# Patient Record
Sex: Male | Born: 1990 | Race: White | Hispanic: No
Health system: Southern US, Community
[De-identification: ages and names within clinical notes are randomized; demographics above are authoritative.]

---

## 2014-07-28 ENCOUNTER — Emergency Department (HOSPITAL_COMMUNITY)

## 2014-07-28 ENCOUNTER — Emergency Department (HOSPITAL_COMMUNITY)
Admission: EM | Admit: 2014-07-28 | Discharge: 2014-07-28 | Disposition: A | Discharge status: Home / Self Care | Attending: Emergency Medicine | Admitting: Emergency Medicine

## 2014-07-28 ENCOUNTER — Encounter (HOSPITAL_COMMUNITY): Payer: Self-pay

## 2014-07-28 DIAGNOSIS — F1910 Other psychoactive substance abuse, uncomplicated: Secondary | ICD-10-CM

## 2014-07-28 DIAGNOSIS — F419 Anxiety disorder, unspecified: Secondary | ICD-10-CM | POA: Insufficient documentation

## 2014-07-28 DIAGNOSIS — R0789 Other chest pain: Secondary | ICD-10-CM | POA: Insufficient documentation

## 2014-07-28 DIAGNOSIS — F1210 Cannabis abuse, uncomplicated: Secondary | ICD-10-CM | POA: Diagnosis present

## 2014-07-28 LAB — CBC WITH DIFFERENTIAL/PLATELET
Basophils Absolute: 0 10*3/uL (ref 0.0–0.1)
Basophils Relative: 0 % (ref 0–1)
EOS PCT: 1 % (ref 0–5)
Eosinophils Absolute: 0 10*3/uL (ref 0.0–0.7)
HEMATOCRIT: 42.7 % (ref 39.0–52.0)
Hemoglobin: 14.9 g/dL (ref 13.0–17.0)
LYMPHS PCT: 31 % (ref 12–46)
Lymphs Abs: 1.6 10*3/uL (ref 0.7–4.0)
MCH: 31.9 pg (ref 26.0–34.0)
MCHC: 34.9 g/dL (ref 30.0–36.0)
MCV: 91.4 fL (ref 78.0–100.0)
MONO ABS: 0.6 10*3/uL (ref 0.1–1.0)
Monocytes Relative: 12 % (ref 3–12)
Neutro Abs: 2.8 10*3/uL (ref 1.7–7.7)
Neutrophils Relative %: 56 % (ref 43–77)
Platelets: 224 10*3/uL (ref 150–400)
RBC: 4.67 MIL/uL (ref 4.22–5.81)
RDW: 13.1 % (ref 11.5–15.5)
WBC: 5.1 10*3/uL (ref 4.0–10.5)

## 2014-07-28 LAB — COMPREHENSIVE METABOLIC PANEL
ALBUMIN: 4.2 g/dL (ref 3.5–5.2)
ALT: 12 U/L (ref 0–53)
AST: 18 U/L (ref 0–37)
Alkaline Phosphatase: 106 U/L (ref 39–117)
Anion gap: 11 (ref 5–15)
BUN: 9 mg/dL (ref 6–23)
CALCIUM: 9.5 mg/dL (ref 8.4–10.5)
CO2: 26 mEq/L (ref 19–32)
CREATININE: 0.8 mg/dL (ref 0.50–1.35)
Chloride: 104 mEq/L (ref 96–112)
GFR calc Af Amer: >90 mL/min (ref >90)
GFR calc non Af Amer: >90 mL/min (ref >90)
Glucose, Bld: 92 mg/dL (ref 70–99)
Potassium: 4.2 mEq/L (ref 3.7–5.3)
Sodium: 141 mEq/L (ref 137–147)
Total Bilirubin: 0.6 mg/dL (ref 0.3–1.2)
Total Protein: 7.3 g/dL (ref 6.0–8.3)

## 2014-07-28 LAB — RAPID URINE DRUG SCREEN, HOSP PERFORMED
Amphetamines: NOT DETECTED
BARBITURATES: NOT DETECTED
BENZODIAZEPINES: NOT DETECTED
COCAINE: NOT DETECTED
Opiates: NOT DETECTED
Tetrahydrocannabinol: NOT DETECTED

## 2014-07-28 MED ORDER — DIPHENHYDRAMINE HCL 50 MG/ML IJ SOLN
25.0000 mg | Freq: Once | INTRAMUSCULAR | Status: AC
  Administered 2014-07-28: 25 mg via INTRAVENOUS
  Filled 2014-07-28: qty 1

## 2014-07-28 MED ORDER — LORAZEPAM 2 MG/ML IJ SOLN
1.0000 mg | Freq: Once | INTRAMUSCULAR | Status: AC
  Administered 2014-07-28: 1 mg via INTRAVENOUS
  Filled 2014-07-28: qty 1

## 2014-07-28 MED ORDER — DIAZEPAM 5 MG/ML IJ SOLN
5.0000 mg | Freq: Once | INTRAMUSCULAR | Status: AC
  Administered 2014-07-28: 5 mg via INTRAVENOUS
  Filled 2014-07-28: qty 2

## 2014-07-28 MED ORDER — SODIUM CHLORIDE 0.9 % IV SOLN
1000.0000 mL | INTRAVENOUS | Status: DC
  Administered 2014-07-28: 1000 mL via INTRAVENOUS

## 2014-07-28 MED ORDER — SODIUM CHLORIDE 0.9 % IV SOLN
1000.0000 mL | Freq: Once | INTRAVENOUS | Status: AC
  Administered 2014-07-28: 1000 mL via INTRAVENOUS

## 2014-07-28 NOTE — ED Provider Notes (Signed)
CSN: [id number]     Arrival date & time 07/28/14  [id number] History   First MD Initiated Contact with Patient 07/28/14 [phone]     Chief Complaint  Patient presents with  . Drug Overdose     (Consider location/radiation/quality/duration/timing/severity/associated sxs/prior Treatment) HPI Comments: Patient is a 23 year old male in the custody of the [facility] who presents to theemergency department after having symptoms that he thinks may be related to smoking K2. The patient states that he smoked one joint of 22. The correctional officers says that this May have happened about 8 or 8:30 in the morning. The patient states that he began to have a panic attack, felt like that his chest was closing in, that he could not breathe well, his vision was altered, and he was having muscle cramps. He states that the sensation of his chest closing in has resolved, but he continues to have muscle cramps and feels a little jittery. The patient states that this was his first interaction with K2. He denies taking any other medications, alcohol, or any other drugs.  Patient is a 23 y.o. male presenting with Overdose. The history is provided by the patient ([employer] engineerdetention officers).  Drug Overdose This is a new problem. The current episode started today. Associated symptoms include headaches. Pertinent negatives include no abdominal pain, arthralgias, chest pain, coughing or neck pain.    No past medical history on file. History reviewed. No pertinent past surgical history. No family history on file. History  Substance Use Topics  . Smoking status: Unknown If Ever Smoked  . Smokeless tobacco: Not on file  . Alcohol Use: No    Review of Systems  Constitutional: Negative for activity change.       All ROS Neg except as noted in HPI  HENT: Negative for nosebleeds.   Eyes: Positive for visual disturbance. Negative for photophobia and discharge.  Respiratory: Positive for chest tightness. Negative for  cough, shortness of breath and wheezing.   Cardiovascular: Positive for palpitations. Negative for chest pain.  Gastrointestinal: Negative for abdominal pain and blood in stool.  Genitourinary: Negative for dysuria, frequency and hematuria.  Musculoskeletal: Negative for back pain, arthralgias and neck pain.  Skin: Negative.   Neurological: Positive for light-headedness and headaches. Negative for dizziness, seizures and speech difficulty.  Psychiatric/Behavioral: Negative for suicidal ideas, hallucinations and confusion.      Allergies  Review of patient's allergies indicates no known allergies.  Home Medications   Prior to Admission medications   Not on File   BP 147/76 mmHg  Pulse 95  Temp(Src) 97.9 F (36.6 C) (Oral)  Resp 16  Ht 6\' 1"  (1.854 m)  Wt 170 lb (77.111 kg)  BMI 22.43 kg/m2  SpO2 95% Physical Exam  Constitutional: He is oriented to person, place, and time. He appears well-developed and well-nourished.  Non-toxic appearance.  HENT:  Head: Normocephalic.  Right Ear: Tympanic membrane and external ear normal.  Left Ear: Tympanic membrane and external ear normal.  Eyes: EOM and lids are normal. Pupils are equal, round, and reactive to light.  Neck: Normal range of motion. Neck supple. Carotid bruit is not present.  Cardiovascular: Normal rate, regular rhythm, normal heart sounds, intact distal pulses and normal pulses.   Pulmonary/Chest: Breath sounds normal. No respiratory distress.  Abdominal: Soft. Bowel sounds are normal. He exhibits no distension. There is no tenderness. There is no guarding.  Musculoskeletal: Normal range of motion.  Intermittent tremors noted of upper and lower extremities. Distal pulses  2+. No edema.  Lymphadenopathy:       Head (right side): No submandibular adenopathy present.       Head (left side): No submandibular adenopathy present.    He has no cervical adenopathy.  Neurological: He is alert and oriented to person, place, and  time. He has normal strength. No cranial nerve deficit or sensory deficit. He exhibits normal muscle tone. Coordination normal.  Skin: Skin is warm and dry.  Psychiatric: His mood appears anxious. His speech is delayed. He expresses no suicidal plans and no homicidal plans.  Nursing note and vitals reviewed.   ED Course  Pt seen with me by Dr Adriana Simasook.  Procedures (including critical care time) CRITICAL CARE Performed by: Kathie DikeBRYANT,Travante Knee M Total critical care time: *40min** Critical care time was exclusive of separately billable procedures and treating other patients. Critical care was necessary to treat or prevent imminent or life-threatening deterioration. Critical care was time spent personally by me on the following activities: development of treatment plan with patient and/or surrogate as well as nursing, discussions with consultants, evaluation of patient's response to treatment, examination of patient, obtaining history from patient or surrogate, ordering and performing treatments and interventions, ordering and review of laboratory studies, ordering and review of radiographic studies, pulse oximetry and re-evaluation of patient's condition. Labs Review Labs Reviewed  URINE RAPID DRUG SCREEN (HOSP PERFORMED)  COMPREHENSIVE METABOLIC PANEL  CBC WITH DIFFERENTIAL    Imaging Review No results found.   EKG Interpretation   Date/Time:  Tuesday July 28 2014 09:26:43 EST Ventricular Rate:  80 PR Interval:  145 QRS Duration: 92 QT Interval:  377 QTC Calculation: 435 R Axis:   75 Text Interpretation:  Sinus rhythm Ventricular premature complex Confirmed  by COOK  MD, BRIAN ([id number]) on 07/28/2014 9:47:48 AM      MDM Pt is not in a coma. No seizure activity reported or noted in ED. No noted visual or auditory hallucinations. Pt c/o spasm of extremities and tingling of extremities and sensation of not being able to breathe at times. He is speaking in complete sentences. He reports  having had a panic attack earlier.    Pt states the muscle cramping seems worse after IV fluids and IV ativan. Comprehensive metabolic panel is well within normal limits. Complete blood count is well within normal limits. Patient will be treated with another dose of Ativan.`  Recheck - vital signs stable. Pt have less spasm of the extremities.  Recheck - After 3rd dose of benzo's spasm almost completely gone. Pt speaks in complete sentences. Headache resolved. Pt eating and drinking in ED without problem. Pt ambulatory  Without problem. Feel it is safe for pt to be d/c to detention staff. They will return if any changes or problem. Venetia MaxonStern warning on danger of using drugs given to the patient.   Final diagnoses:  Drug abuse    *I have reviewed nursing notes, vital signs, and all appropriate lab and imaging results for this patient.[address]    Camil Hausmann M Osiel Stick, PA-C 07/31/14 1154  Donnetta HutchingBrian Cook, MD 08/04/14 [phone]

## 2014-07-28 NOTE — ED Notes (Signed)
[facility]der[facility].  Per officers with pt, he had gone to medical this morning after ingestion of drugs. Pt is confused and is having a hard time concentrating at present.

## 2014-07-28 NOTE — Discharge Instructions (Signed)
K2 is dangerous. Please stay away from this and all drugs. Please let your facility MD know of any changes or problems.

## 2015-07-02 IMAGING — CR DG CHEST 1V PORT
1 series · 2 of 2 positions shown · non-contrast
Comparison: None.

CLINICAL DATA: Drug abuse.

EXAM:
PORTABLE CHEST - 1 VIEW

[Series 1: portable · 0.17mm/px · 2 of 2 slices shown]
[im 1/2]
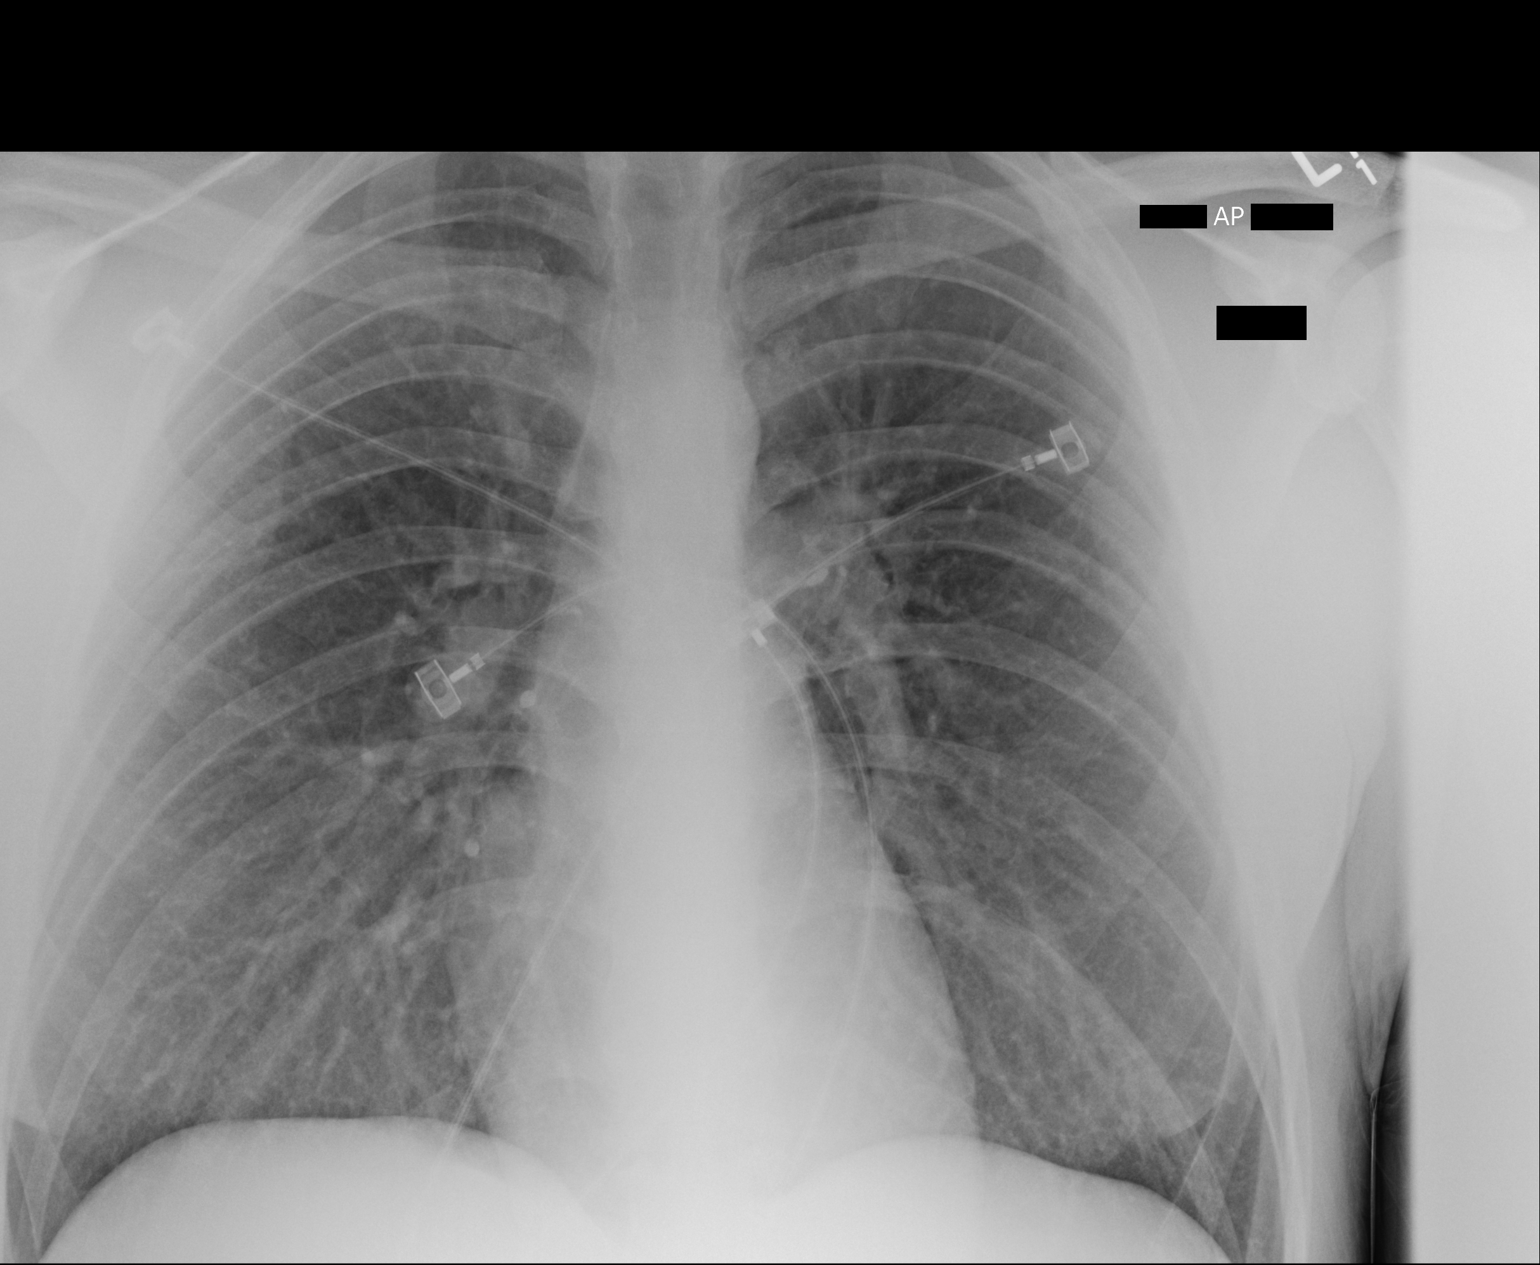
[im 2/2]
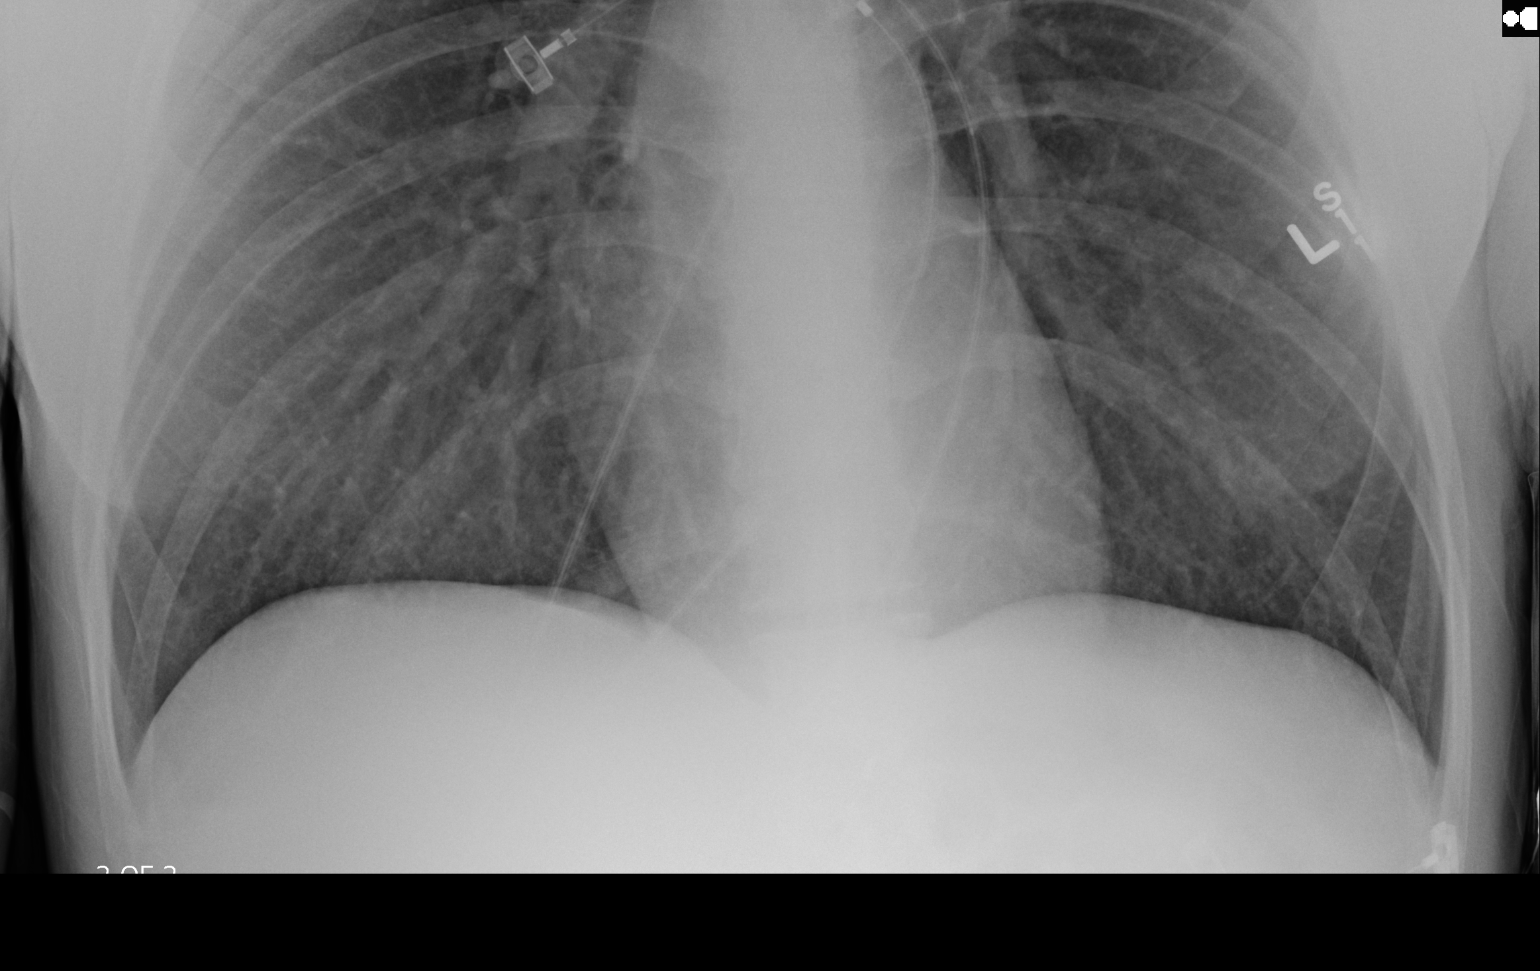

[2 of 2 positions shown; findings below may reference images not displayed]

FINDINGS: The heart size and mediastinal contours are within normal limits.
Mild central pulmonary vascular congestion is noted. No pneumothorax
or pleural effusion is noted. No consolidative process is noted. The
visualized skeletal structures are unremarkable.
IMPRESSION: Mild central pulmonary vascular congestion is noted. No other
significant abnormality seen in the chest.

## 2017-10-02 ENCOUNTER — Inpatient Hospital Stay

## 2017-10-02 ENCOUNTER — Inpatient Hospital Stay: Admit: 2017-10-02 | Payer: Self-pay | Admitting: Internal Medicine

## 2017-10-02 ENCOUNTER — Inpatient Hospital Stay: Admission: AD | Admit: 2017-10-02 | Source: Ambulatory Visit | Admitting: Internal Medicine

## 2017-10-02 ENCOUNTER — Telehealth: Payer: Self-pay | Admitting: Hematology and Oncology

## 2017-10-02 NOTE — Telephone Encounter (Signed)
Per Quentin MullingBryan Gray to sch lab port flush apt for 10/02/17
